# Patient Record
Sex: Female | Born: 1985 | Hispanic: Yes | Marital: Single | Smoking: Never smoker
Health system: Southern US, Community
[De-identification: ages and names within clinical notes are randomized; demographics above are authoritative.]

---

## 2019-01-19 ENCOUNTER — Emergency Department: Payer: Worker's Compensation

## 2019-01-19 ENCOUNTER — Encounter: Payer: Self-pay | Admitting: Emergency Medicine

## 2019-01-19 ENCOUNTER — Other Ambulatory Visit: Payer: Self-pay

## 2019-01-19 ENCOUNTER — Emergency Department
Admission: EM | Admit: 2019-01-19 | Discharge: 2019-01-19 | Disposition: A | Discharge status: Home / Self Care | Payer: Self-pay | Attending: Emergency Medicine | Admitting: Emergency Medicine

## 2019-01-19 DIAGNOSIS — Y929 Unspecified place or not applicable: Secondary | ICD-10-CM | POA: Insufficient documentation

## 2019-01-19 DIAGNOSIS — Z880 Allergy status to penicillin: Secondary | ICD-10-CM | POA: Insufficient documentation

## 2019-01-19 DIAGNOSIS — W228XXA Striking against or struck by other objects, initial encounter: Secondary | ICD-10-CM | POA: Insufficient documentation

## 2019-01-19 DIAGNOSIS — Y990 Civilian activity done for income or pay: Secondary | ICD-10-CM | POA: Insufficient documentation

## 2019-01-19 DIAGNOSIS — M7918 Myalgia, other site: Secondary | ICD-10-CM | POA: Insufficient documentation

## 2019-01-19 DIAGNOSIS — S060X0A Concussion without loss of consciousness, initial encounter: Secondary | ICD-10-CM | POA: Insufficient documentation

## 2019-01-19 DIAGNOSIS — Y9389 Activity, other specified: Secondary | ICD-10-CM | POA: Insufficient documentation

## 2019-01-19 DIAGNOSIS — M545 Low back pain, unspecified: Secondary | ICD-10-CM

## 2019-01-19 LAB — POCT PREGNANCY, URINE: Preg Test, Ur: NEGATIVE

## 2019-01-19 MED ORDER — CYCLOBENZAPRINE HCL 10 MG PO TABS: 10.0000 mg | ORAL_TABLET | Freq: Three times a day (TID) | ORAL | 0 refills | Status: DC | PRN

## 2019-01-19 MED ORDER — NAPROXEN 500 MG PO TABS: 500.0000 mg | ORAL_TABLET | Freq: Two times a day (BID) | ORAL | 0 refills | Status: DC

## 2019-01-19 MED ORDER — KETOROLAC TROMETHAMINE 30 MG/ML IJ SOLN
15.0000 mg | Freq: Once | INTRAMUSCULAR | Status: AC
  Administered 2019-01-19: 17:00:00 15 mg via INTRAMUSCULAR
  Filled 2019-01-19: qty 1

## 2019-01-19 NOTE — ED Notes (Signed)
This tech and hospital interpreter in room to complete urine drug screen and paperwork with pt. Pt stating she cannot urinate and requesting water. Will return to complete urine drug screen.

## 2019-01-19 NOTE — ED Provider Notes (Signed)
[facility] Emergency Department Provider Note   ____________________________________________   First MD Initiated Contact with Patient 01/19/19 1445     (approximate)  I have reviewed the triage vital signs and the nursing notes.   HISTORY  Chief Complaint Head Injury    HPI Willena Clydie BraunCorea Candis Musanriquez is a 33 y.o. female who presents to the emergency department for treatment and evaluation of head injury and back pain post fall at work 2 days ago. She was stacking large spools and they toppled over and landed on her back. She fell forward and hit her head on the concrete. Since then she has had nausea and dizziness with headache. No relief with tylenol.    History reviewed. No pertinent past medical history.  There are no active problems to display for this patient.   History reviewed. No pertinent surgical history.  Prior to Admission medications   Medication Sig Start Date End Date Taking? Authorizing Provider  cyclobenzaprine (FLEXERIL) 10 MG tablet Take 1 tablet (10 mg total) by mouth 3 (three) times daily as needed. 01/19/19   Jennfer Gassen, Rulon Eisenmengerari B, FNP  naproxen (NAPROSYN) 500 MG tablet Take 1 tablet (500 mg total) by mouth 2 (two) times daily with a meal. 01/19/19   Surena Welge B, FNP    Allergies Penicillins  No family history on file.  Social History Social History   Tobacco Use  . Smoking status: Never Smoker  . Smokeless tobacco: Never Used  Substance Use Topics  . Alcohol use: Not on file  . Drug use: Not on file    Review of Systems  Constitutional: No fever/chills Eyes: Positive for blurred vision. ENT: No sore throat. Cardiovascular: Denies chest pain. Respiratory: Denies shortness of breath. Gastrointestinal: No abdominal pain.  No nausea, no vomiting.  No diarrhea.  No constipation. Genitourinary: Negative for dysuria. Musculoskeletal: Negative for back pain. Skin: Negative for rash. Neurological: Positive for headaches,  negative for focal weakness or numbness.   ____________________________________________   PHYSICAL EXAM:  VITAL SIGNS: ED Triage Vitals  Enc Vitals Group     BP 01/19/19 1329 114/67     Pulse Rate 01/19/19 1329 (!) 55     Resp 01/19/19 1329 16     Temp 01/19/19 1329 98.3 F (36.8 C)     Temp Source 01/19/19 1329 Oral     SpO2 01/19/19 1329 100 %     Weight 01/19/19 1323 140 lb (63.5 kg)     Height --      Head Circumference --      Peak Flow --      Pain Score 01/19/19 1322 7     Pain Loc --      Pain Edu? --      Excl. in GC? --     Constitutional: Alert and oriented. Well appearing and in no acute distress. Eyes: Conjunctivae are normal. PERRL. EOMI. Lateral nystagmus bilaterally  Head: Atraumatic. Nose: No congestion/rhinnorhea. Mouth/Throat: Mucous membranes are moist.  Oropharynx non-erythematous. Neck: No stridor.   Cardiovascular: Normal rate, regular rhythm. Grossly normal heart sounds.  Good peripheral circulation. Respiratory: Normal respiratory effort.  No retractions. Lungs CTAB. Gastrointestinal: Soft and nontender. No distention. No abdominal bruits. No CVA tenderness. Musculoskeletal: No lower extremity tenderness nor edema.  No joint effusions. Neurologic:  Normal speech and language. No gross focal neurologic deficits are appreciated. No gait instability. Grip strength is equal.  Skin:  Skin is warm, dry and intact. No rash noted. Psychiatric: Mood and affect  are normal. Speech and behavior are normal.  ____________________________________________   LABS (all labs ordered are listed, but only abnormal results are displayed)  Labs Reviewed  POC URINE PREG, ED  POCT PREGNANCY, URINE   ____________________________________________  EKG  Not indicated. ____________________________________________  RADIOLOGY  ED MD interpretation: CT of the head is negative for acute findings per radiology.  Lumbar spine and cervical spine images are negative  for acute bony abnormality per radiology.    Official radiology report(s): Dg Cervical Spine 2-3 Views  Result Date: 01/19/2019 CLINICAL DATA:  Arrives from [facility] for ED evaluation of head injury. Patient hit head on steel bar on Saturday. Arrives today with c/o persistent nausea. Neck and lower back pain. EXAM: CERVICAL SPINE - 2-3 VIEW COMPARISON:  None. FINDINGS: There is no evidence of cervical spine fracture or prevertebral soft tissue swelling. Alignment is normal. No other significant bone abnormalities are identified. IMPRESSION: Negative cervical spine radiographs. Electronically Signed   By: Amie Portlandavid  Ormond M.D.   On: 01/19/2019 16:41   Dg Lumbar Spine 2-3 Views  Result Date: 01/19/2019 CLINICAL DATA:  [facility]. Patient hit head on steel bar on Saturday. Arrives today with c/o persistent nausea. Neck and lower back pain. EXAM: LUMBAR SPINE - 2-3 VIEW COMPARISON:  None. FINDINGS: No fracture or bone lesion. No spondylolisthesis. Disc spaces are well maintained. No degenerative change. There are calcifications in the right mid to upper abdomen, to the right of the upper endplate of L1, anterior to the renal shadow. These may reflect gallstones. Soft tissues are otherwise unremarkable. IMPRESSION: 1. Possible gallstones. Otherwise normal. No evidence of injury to the lumbar spine. Electronically Signed   By: Amie Portlandavid  Ormond M.D.   On: 01/19/2019 16:41   Ct Head Wo Contrast  Result Date: 01/19/2019 CLINICAL DATA:  Trauma to the head on Saturday with headaches. EXAM: CT HEAD WITHOUT CONTRAST TECHNIQUE: Contiguous axial images were obtained from the base of the skull through the vertex without intravenous contrast. COMPARISON:  None. FINDINGS: Brain: No evidence of acute infarction, hemorrhage, hydrocephalus, extra-axial collection or mass lesion/mass effect. Vascular: No hyperdense vessel or unexpected calcification. Skull: Normal. Negative for fracture or focal  lesion. Sinuses/Orbits: No acute finding. Other: None. IMPRESSION: No focal acute intracranial abnormality identified. Electronically Signed   By: Sherian ReinWei-Chen  Lin M.D.   On: 01/19/2019 16:32    ____________________________________________   PROCEDURES  Procedure(s) performed: None  Procedures  Critical Care performed: No  ____________________________________________   INITIAL IMPRESSION / ASSESSMENT AND PLAN / ED COURSE     33 year old female presenting to the emergency department for treatment and evaluation 2 days after mechanical, non-syncopal fall causing her to strike her head on the floor while at work.  See HPI for further details.  Due to continuation of headache, complaint of dizziness, blurred vision, and nystagmus on exam she will undergo CT scan of her head.  She has some diffuse and pretty nonfocal tenderness along the length of her spine with the greatest area of pain in the lumbar and cervical spine.  X-rays will be obtained.  CT of the head, image of the cervical spine, and lumbar spine are all reassuring.  The patient has been ambulatory in the emergency department without assistance and with a steady gait.  While here, she was given Toradol with some decrease in pain.  She will be given prescriptions for Naprosyn and Flexeril.  She is to follow-up with a primary care provider for choice  for symptoms are not improving over the next few days.  She will be provided a work note to return in 2 days.  She is to return to the emergency department for symptoms of change or worsen if she is unable to schedule an appointment.  Interpreters Ronnald Collum and [employer] utilized for exam and communication.    ____________________________________________   FINAL CLINICAL IMPRESSION(S) / ED DIAGNOSES  Final diagnoses:  Acute lumbar back pain  Concussion without loss of consciousness, initial encounter  Musculoskeletal pain     ED Discharge Orders         Ordered    cyclobenzaprine  (FLEXERIL) 10 MG tablet  3 times daily PRN     01/19/19 1827    naproxen (NAPROSYN) 500 MG tablet  2 times daily with meals     01/19/19 1827           Note:  This document was prepared using Dragon voice recognition software and may include unintentional dictation errors.    Victorino Dike, FNP 01/19/19 1843    Vanessa Lincoln, MD 01/19/19 2040

## 2019-01-19 NOTE — ED Notes (Signed)
Presents with head pain  States a buggy rolled back and hither in the head and back  Denies any LOC

## 2019-01-19 NOTE — ED Triage Notes (Signed)
Arrives from [facility] for ED evaluation of head injury. Patient hit head on steel bar on Saturday.  Arrives today with c/o persistent nausea.  Patient is AAOx3.  Skin warm and dry. NAD

## 2019-01-19 NOTE — Discharge Instructions (Addendum)
Do not drive or operate heavy machinery if you take the flexeril.   Follow up with the primary care provider of your choice in 2-3 days if you are not feeling better.  Return to the ER for symptoms that change or worsen if unable to schedule an appointment.

## 2019-01-25 ENCOUNTER — Emergency Department
Admission: EM | Admit: 2019-01-25 | Discharge: 2019-01-25 | Disposition: A | Discharge status: Home / Self Care | Payer: Self-pay | Attending: Emergency Medicine | Admitting: Emergency Medicine

## 2019-01-25 ENCOUNTER — Other Ambulatory Visit: Payer: Self-pay

## 2019-01-25 ENCOUNTER — Encounter: Payer: Self-pay | Admitting: Emergency Medicine

## 2019-01-25 DIAGNOSIS — R51 Headache: Secondary | ICD-10-CM | POA: Insufficient documentation

## 2019-01-25 DIAGNOSIS — R519 Headache, unspecified: Secondary | ICD-10-CM

## 2019-01-25 DIAGNOSIS — Z79899 Other long term (current) drug therapy: Secondary | ICD-10-CM | POA: Insufficient documentation

## 2019-01-25 DIAGNOSIS — Z20828 Contact with and (suspected) exposure to other viral communicable diseases: Secondary | ICD-10-CM | POA: Insufficient documentation

## 2019-01-25 LAB — SARS CORONAVIRUS 2 BY RT PCR (HOSPITAL ORDER, PERFORMED IN ~~LOC~~ HOSPITAL LAB): SARS Coronavirus 2: NEGATIVE

## 2019-01-25 MED ORDER — METOCLOPRAMIDE HCL 5 MG/ML IJ SOLN
10.0000 mg | Freq: Once | INTRAMUSCULAR | Status: AC
  Administered 2019-01-25: 10 mg via INTRAVENOUS
  Filled 2019-01-25: qty 2

## 2019-01-25 MED ORDER — SODIUM CHLORIDE 0.9 % IV BOLUS
1000.0000 mL | Freq: Once | INTRAVENOUS | Status: AC
  Administered 2019-01-25: 22:00:00 1000 mL via INTRAVENOUS

## 2019-01-25 MED ORDER — KETOROLAC TROMETHAMINE 30 MG/ML IJ SOLN
30.0000 mg | Freq: Once | INTRAMUSCULAR | Status: AC
  Administered 2019-01-25: 21:00:00 30 mg via INTRAVENOUS
  Filled 2019-01-25: qty 1

## 2019-01-25 MED ORDER — DIPHENHYDRAMINE HCL 50 MG/ML IJ SOLN
50.0000 mg | Freq: Once | INTRAMUSCULAR | Status: AC
  Administered 2019-01-25: 21:00:00 50 mg via INTRAVENOUS
  Filled 2019-01-25: qty 1

## 2019-01-25 NOTE — ED Triage Notes (Signed)
Arrives to ED today to be rechecked.  Patient was injured at work on 7/25 and was seen through the ED on 7/28.  Initially was given a work note and was told to return to ED today.  Patient arrives today with complaint of persistent dizziness and feeling weak.  States today woke up c/o voice hoarseness and productive cough.  Also  c/o 3 day history of chills intermittently.  Patient is AAOx3.  Skin warm and dry.   MAE equally and strong.  Voice clear and strong.  NAD

## 2019-01-25 NOTE — ED Provider Notes (Signed)
[facility] Emergency Department Provider Note  Time seen: 9:04 PM  I have reviewed the triage vital signs and the nursing notes.   HISTORY  Chief Complaint Dizziness Headache Cough   HPI Norma Salazar is a 33 y.o. female with no past medical history who presents to the emergency department for headache, cough and sore throat as well as dizziness.  According to the patient she had an injury at work on Saturday when she struck her head.  Patient was seen in the emergency department following that injury, possible concussion.  Ever since that accident the patient states she has had a moderate headache as well as dizziness.  States she was unable to get into her primary care doctor today so she came back to the emergency department for evaluation.  As a secondary complaint the patient also states since yesterday she has been coughing and has had a sore throat with subjective fever at home.  No known sick contacts.  Negative review of systems otherwise.   History reviewed. No pertinent past medical history.  There are no active problems to display for this patient.   History reviewed. No pertinent surgical history.  Prior to Admission medications   Medication Sig Start Date End Date Taking? Authorizing Provider  cyclobenzaprine (FLEXERIL) 10 MG tablet Take 1 tablet (10 mg total) by mouth 3 (three) times daily as needed. 01/19/19   Triplett, Rulon Eisenmengerari B, FNP  naproxen (NAPROSYN) 500 MG tablet Take 1 tablet (500 mg total) by mouth 2 (two) times daily with a meal. 01/19/19   Triplett, Cari B, FNP    Allergies  Allergen Reactions  . Penicillins Rash    No family history on file.  Social History Social History   Tobacco Use  . Smoking status: Never Smoker  . Smokeless tobacco: Never Used  Substance Use Topics  . Alcohol use: Not on file  . Drug use: Not on file    Review of Systems Constitutional: Subjective fever ENT: Sore throat Cardiovascular:  Negative for chest pain. Respiratory: Negative for shortness of breath.  Cough x2 days Gastrointestinal: Negative for abdominal pain, vomiting and diarrhea. Musculoskeletal: Negative for musculoskeletal complaints Skin: Negative for skin complaints  Neurological: Moderate headache All other ROS negative  ____________________________________________   PHYSICAL EXAM:  VITAL SIGNS: ED Triage Vitals  Enc Vitals Group     BP 01/25/19 1617 106/64     Pulse Rate 01/25/19 1617 69     Resp 01/25/19 1617 16     Temp 01/25/19 1617 98.8 F (37.1 C)     Temp Source 01/25/19 1617 Oral     SpO2 01/25/19 1617 100 %     Weight 01/25/19 1615 139 lb 15.9 oz (63.5 kg)     Height --      Head Circumference --      Peak Flow --      Pain Score 01/25/19 1614 5     Pain Loc --      Pain Edu? --      Excl. in GC? --    Constitutional: Alert and oriented. Well appearing and in no distress. Eyes: Normal exam ENT      Head: Normocephalic and atraumatic.      Mouth/Throat: Mucous membranes are moist. Cardiovascular: Normal rate, regular rhythm. Respiratory: Normal respiratory effort without tachypnea nor retractions. Breath sounds are clear  Gastrointestinal: Soft and nontender. No distention. Musculoskeletal: Nontender with normal range of motion in all extremities.  Neurologic:  Normal speech  and language. No gross focal neurologic deficits Skin:  Skin is warm, dry and intact.  Psychiatric: Mood and affect are normal.   ____________________________________________   INITIAL IMPRESSION / ASSESSMENT AND PLAN / ED COURSE  Pertinent labs & imaging results that were available during my care of the patient were reviewed by me and considered in my medical decision making (see chart for details).   Patient presents to the emergency department for sore throat cough headache and dizziness.  Patient had a head injury on Saturday was seen in the emergency department at that time.  Continues to have  headache and intermittent dizziness since that time, possible concussion.  Secondary complaint of cough since yesterday as well as subjective fever and sore throat.  Suspect postconcussive syndrome, we will treat the patient's headache with a migraine cocktail medications, we will check for coronavirus as a precaution.  Overall the patient appears extremely well with a reassuring physical exam and reassuring vitals.  Patient is requesting a work note as well.  Corona test is negative.  Patient is feeling better after migraine cocktail.  We will discharge home with PCP follow-up.  Norma Salazar was evaluated in Emergency Department on 01/25/2019 for the symptoms described in the history of present illness. She was evaluated in the context of the global COVID-19 pandemic, which necessitated consideration that the patient might be at risk for infection with the SARS-CoV-2 virus that causes COVID-19. Institutional protocols and algorithms that pertain to the evaluation of patients at risk for COVID-19 are in a state of rapid change based on information released by regulatory bodies including the CDC and federal and state organizations. These policies and algorithms were followed during the patient's care in the ED.  ____________________________________________   FINAL CLINICAL IMPRESSION(S) / ED DIAGNOSES  Headache Dizziness   Harvest Dark, MD 01/25/19 2236

## 2019-02-16 ENCOUNTER — Ambulatory Visit: Payer: Self-pay

## 2019-02-16 ENCOUNTER — Other Ambulatory Visit: Payer: Self-pay

## 2019-02-16 DIAGNOSIS — Z20822 Contact with and (suspected) exposure to covid-19: Secondary | ICD-10-CM

## 2019-02-16 NOTE — Telephone Encounter (Signed)
Message from Jodie Echevaria sent at 02/16/2019 2:44 PM EDT  Patient went for covid testing today but does not have anything to show her job that she have been tested. Asking for a call back so that she can get a letter as proof that she was tested. Patient speaks no english so she called with her boyfriend. Please call patient at Ph# [phone]    Called pt. via [employer]; ID # [phone].  Pt. Requested letter be faxed to her employer to show that she had COVID testing done today.  Pt's boyfriend also spoke on call, to assist pt. with communicating her needs.  Pt's boyfriend stated he tested positive, and has been in quarantine for 14 days, and now the pt. is having symptoms of loss of smell and taste, decreased appetite, and body aches.  Requesting to fax letter to [facility] @ [phone].  Advised pt. And boyfriend that a letter will be faxed this afternoon.  Verb. Understanding.  Agreed with plan.

## 2019-02-17 LAB — NOVEL CORONAVIRUS, NAA: SARS-CoV-2, NAA: NOT DETECTED

## 2019-03-09 ENCOUNTER — Other Ambulatory Visit: Payer: Self-pay

## 2019-03-09 DIAGNOSIS — Z20822 Contact with and (suspected) exposure to covid-19: Secondary | ICD-10-CM

## 2019-03-11 LAB — NOVEL CORONAVIRUS, NAA: SARS-CoV-2, NAA: NOT DETECTED

## 2019-03-31 ENCOUNTER — Telehealth: Payer: Self-pay | Admitting: *Deleted

## 2019-03-31 NOTE — Telephone Encounter (Signed)
Patient and her boyfriend, Norma Salazar acting as spanish interpreter, requesting a letter stating dates she was advised to quarantine due to exposure to her boyfriend who reportedly was positive covid19 with symptoms. Explained we could provide a faxed copy of the negative results to her employer, Norma Salazar @ [phone]. She has a letter from the health department regarding her quarantine time period. Advised to contact health department for letter with information regards to her symptoms and how long they advised she quarantine for. Stated they would contact health department for that information.  4:17p Request made to fax information.

## 2019-04-06 NOTE — Telephone Encounter (Signed)
Will request results are faxed to number below in previous message.

## 2019-04-06 NOTE — Telephone Encounter (Signed)
Patient's boyfriend calling back requesting this be refaxed. Fax number below is correct.

## 2019-05-03 ENCOUNTER — Encounter: Payer: Self-pay | Admitting: Emergency Medicine

## 2019-05-03 ENCOUNTER — Other Ambulatory Visit: Payer: Self-pay

## 2019-05-03 ENCOUNTER — Emergency Department
Admission: EM | Admit: 2019-05-03 | Discharge: 2019-05-03 | Disposition: A | Discharge status: Home / Self Care | Payer: BC Managed Care – PPO | Attending: Emergency Medicine | Admitting: Emergency Medicine

## 2019-05-03 DIAGNOSIS — J029 Acute pharyngitis, unspecified: Secondary | ICD-10-CM | POA: Diagnosis not present

## 2019-05-03 DIAGNOSIS — B349 Viral infection, unspecified: Secondary | ICD-10-CM | POA: Insufficient documentation

## 2019-05-03 DIAGNOSIS — Z20828 Contact with and (suspected) exposure to other viral communicable diseases: Secondary | ICD-10-CM | POA: Insufficient documentation

## 2019-05-03 DIAGNOSIS — R05 Cough: Secondary | ICD-10-CM | POA: Diagnosis present

## 2019-05-03 MED ORDER — PSEUDOEPH-BROMPHEN-DM 30-2-10 MG/5ML PO SYRP: 10.0000 mL | ORAL_SOLUTION | Freq: Four times a day (QID) | ORAL | 0 refills | Status: AC | PRN

## 2019-05-03 MED ORDER — MAGIC MOUTHWASH W/LIDOCAINE: 5.0000 mL | Freq: Four times a day (QID) | ORAL | 0 refills | Status: AC

## 2019-05-03 MED ORDER — ONDANSETRON 4 MG PO TBDP: 4.0000 mg | ORAL_TABLET | Freq: Three times a day (TID) | ORAL | 0 refills | Status: AC | PRN

## 2019-05-03 NOTE — ED Triage Notes (Signed)
Cough and sore throat x 3 days. Nausea with emesis x 1 yesterday.

## 2019-05-03 NOTE — ED Provider Notes (Signed)
[facility] Emergency Department Provider Note  ____________________________________________   None    (approximate)   I have reviewed the triage vital signs and the nursing notes.       HISTORY  Chief Complaint Nausea, Cough, and Sore Throat    HPI Norma Salazar is a 33 y.o. female presents to the emergency department with a complaint of cough and nausea. Patient has had 3 days of cough and last night began with nausea. Intermittent sore throat. 1 episode of emesis. No abdominal pain. No diarrhea. No urinary symptoms.       History reviewed. No pertinent past medical history.  There are no active problems to display for this patient.   History reviewed. No pertinent surgical history.  Prior to Admission medications   Medication Sig Start Date End Date Taking? Authorizing Provider  brompheniramine-pseudoephedrine-DM 30-2-10 MG/5ML syrup Take 10 mLs by mouth 4 (four) times daily as needed. 05/03/19   Cuthriell, Delorise Royals, PA-C  cyclobenzaprine (FLEXERIL) 10 MG tablet Take 1 tablet (10 mg total) by mouth 3 (three) times daily as needed. 01/19/19   Triplett, Rulon Eisenmenger B, FNP  magic mouthwash w/lidocaine SOLN Take 5 mLs by mouth 4 (four) times daily. 05/03/19   Cuthriell, Delorise Royals, PA-C  naproxen (NAPROSYN) 500 MG tablet Take 1 tablet (500 mg total) by mouth 2 (two) times daily with a meal. 01/19/19   Triplett, Cari B, FNP  ondansetron (ZOFRAN-ODT) 4 MG disintegrating tablet Take 1 tablet (4 mg total) by mouth every 8 (eight) hours as needed for nausea or vomiting. 05/03/19   Cuthriell, Delorise Royals, PA-C    Allergies Penicillins  No family history on file.  Social History Social History   Tobacco Use  . Smoking status: Never Smoker  . Smokeless tobacco: Never Used  Substance Use Topics  . Alcohol use: Not on file  . Drug use: Not on file    Review of Systems Constitutional: no fever ENT: positive nasal congestion/rhinorhea. positive  sore throat Cardiovascular: no chest pain. Respiratory: positive cough. no shortness of breath/difficulty breathing Gastroenterology: no abdominal pain. Positive for nausea and emesis.  Musculoskeletal: no for musculoskeletal pain Integumentary: Negative for rash. Neurological: No focal weakness nor numbness.   ____________________________________________   PHYSICAL EXAM:  VITAL SIGNS: ED Triage Vitals  Enc Vitals Group     BP      Pulse      Resp      Temp      Temp src      SpO2      Weight      Height      Head Circumference      Peak Flow      Pain Score      Pain Loc      Pain Edu?      Excl. in GC?     Constitutional: Alert and oriented. Generally well appearing and in no acute distress. Eyes: Conjunctivae are normal.  Nose: No significant congestion/rhinnorhea. Mouth: No gross oropharyngeal edema. no erythema/edema Neck: No stridor.  No meningeal signs.   Cardiovascular: Grossly normal heart sounds. Respiratory: Normal respiratory effort without significant tachypnea and no observed retractions. Lungs CTAB Gastrointestinal: No significant visible abdominal wall findings.  Bowel sounds x4 quadrants. mild tenderness to palpation in epigastric region. Musculoskeletal: No gross deformities of extremities. Neurologic:  Normal speech and language. No gross focal neurologic deficits are appreciated.  Skin:  Skin is warm, dry and intact. No rash noted.  ____________________________________________   LABS (all labs ordered are listed, but only abnormal results are displayed)  Labs Reviewed  SARS CORONAVIRUS 2 (TAT 6-24 HRS)    ____________________________________________   RADIOLOGY   Official radiology report(s): No results found.  ____________________________________________    INITIAL IMPRESSION / MDM / ASSESSMENT AND PLAN / ED COURSE  As part of my medical decision making, I reviewed the following data within the electronic MEDICAL RECORD NUMBER  Notes from prior ED visits      Clinical Impression: Viral illness   Plan: Covid swab. Symptomatic control medications will be prescribed. Follow-up with pcp as needed. Return precautions discussed with patient.  Patient has been screened based based on their arrival complaint, evaluated for an emergent condition.. Patient is stable for discharge    ____________________________________________  Note:  This document was prepared using Dragon voice recognition software and may include unintentional dictation errors.    Darletta Moll, PA-C 05/04/19 2258    Earleen Newport, MD 05/05/19 1049

## 2019-05-03 NOTE — ED Notes (Signed)
See triage note  States she has cough and sore throat for 3 days    Afebrile on arrival

## 2019-05-04 LAB — SARS CORONAVIRUS 2 (TAT 6-24 HRS): SARS Coronavirus 2: NEGATIVE

## 2019-05-06 ENCOUNTER — Telehealth: Payer: Self-pay | Admitting: General Practice

## 2019-05-06 NOTE — Telephone Encounter (Signed)
Negative COVID results given. Patient results "NOT Detected." Caller expressed understanding. ° °

## 2020-07-17 IMAGING — CR CERVICAL SPINE - 2-3 VIEW
1 series · 3 of 3 positions shown · non-contrast
Comparison: None.

CLINICAL DATA: Arrives from [REDACTED] for ED evaluation of head injury.
Patient hit head on steel bar on [REDACTED]. Arrives today with c/o
persistent nausea. Neck and lower back pain.

EXAM:
CERVICAL SPINE - 2-3 VIEW

[Series 1: dg cervical spine 2 or 3 views · 0.14mm/px · 3 of 3 slices shown]
[im 1/3]
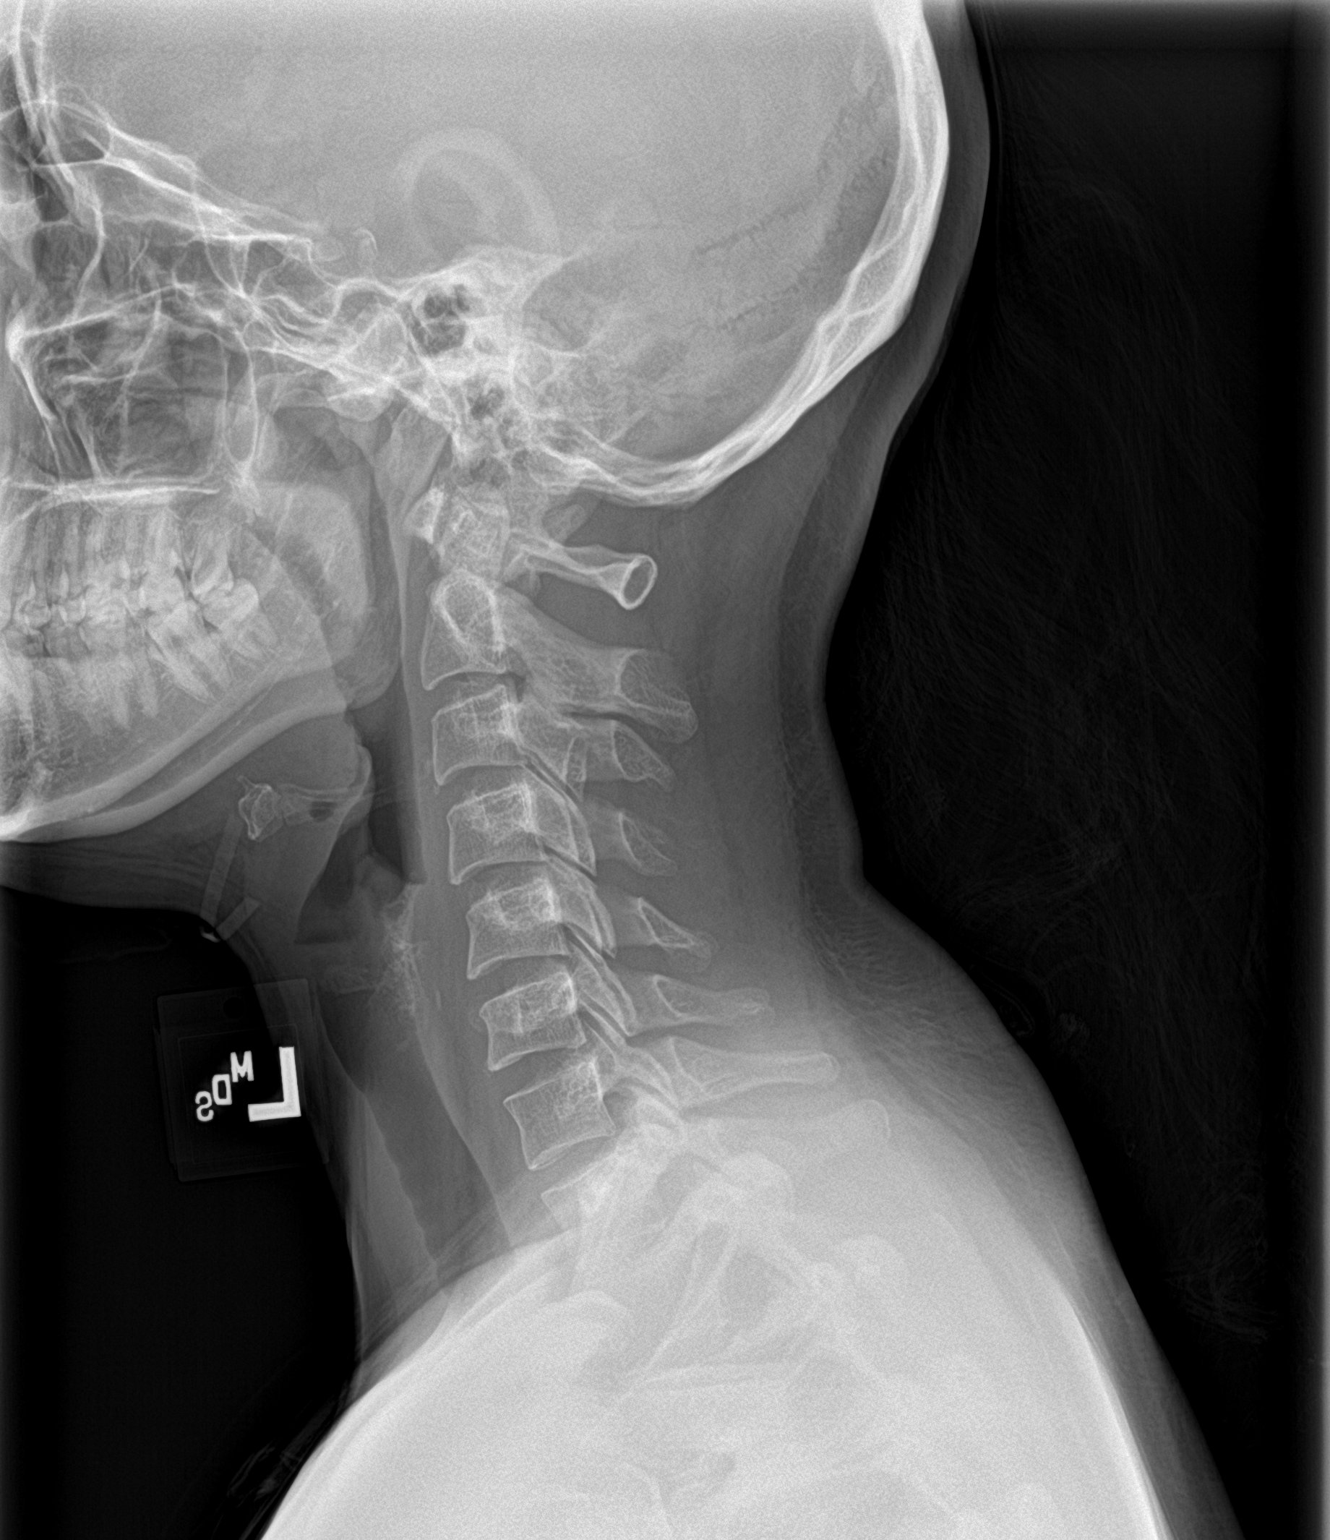
[im 2/3]
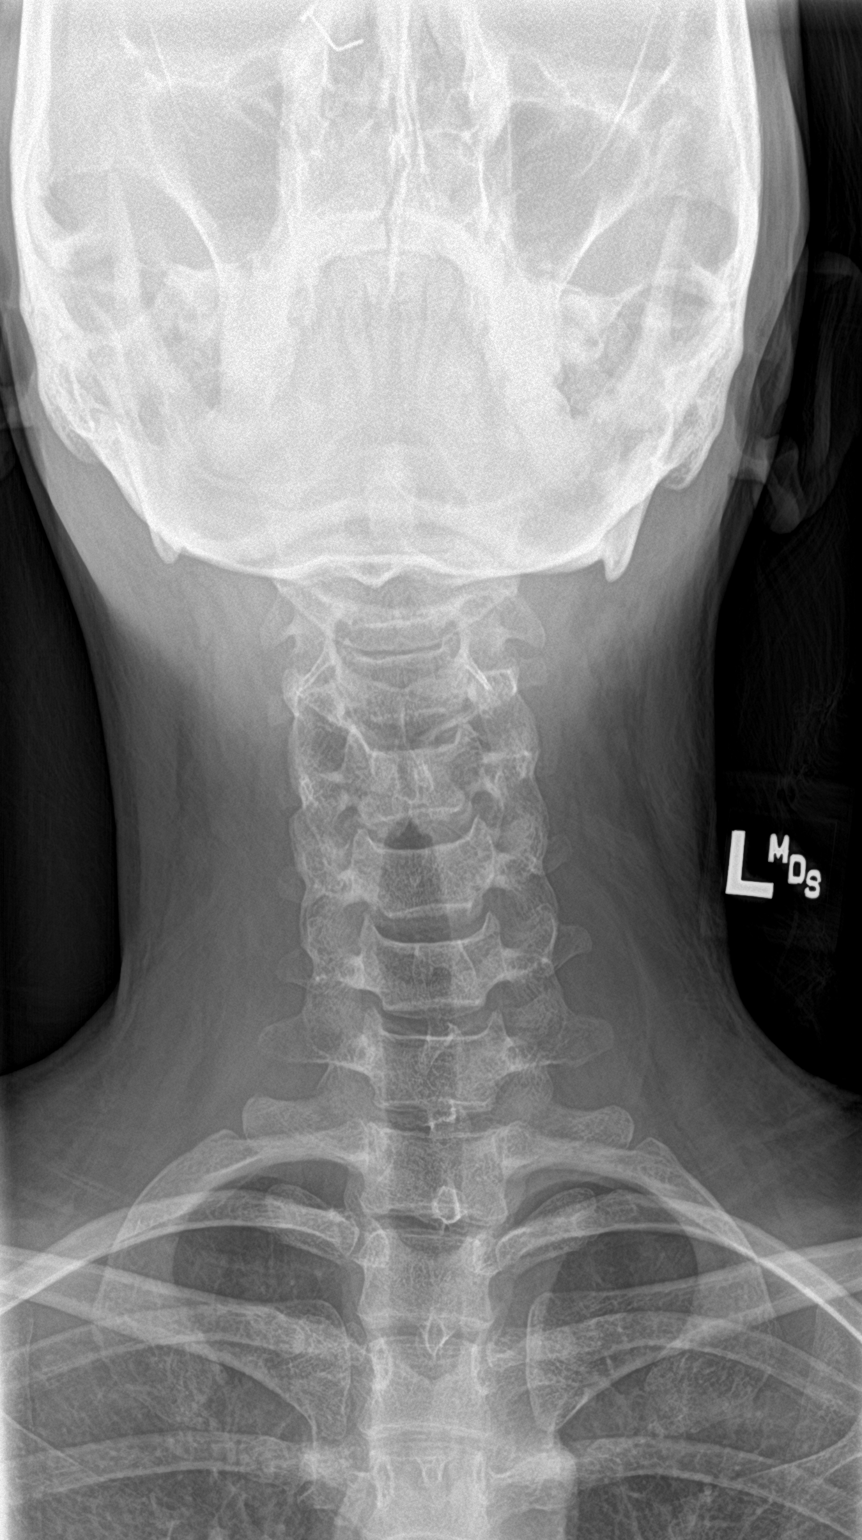
[im 3/3]
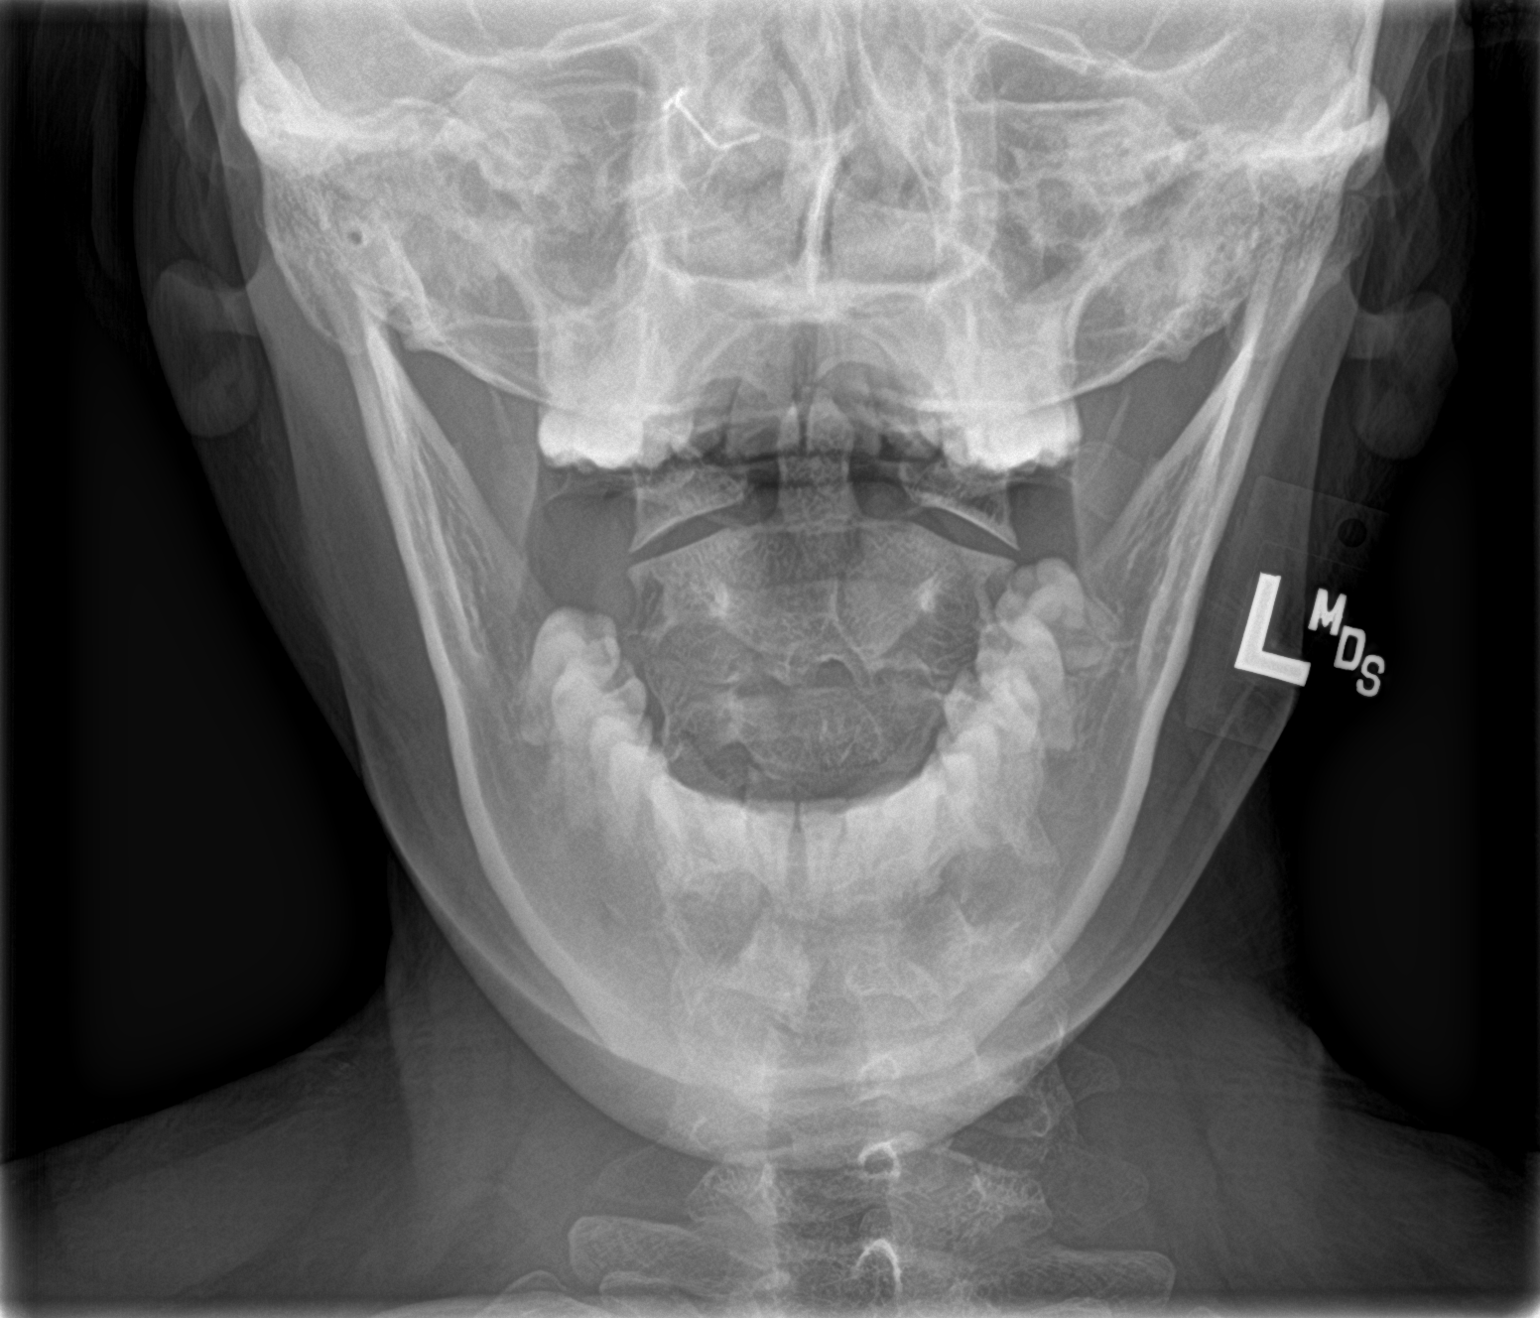

[3 of 3 positions shown; findings below may reference images not displayed]

FINDINGS: There is no evidence of cervical spine fracture or prevertebral soft
tissue swelling. Alignment is normal. No other significant bone
abnormalities are identified.
IMPRESSION: Negative cervical spine radiographs.

## 2020-07-17 IMAGING — CT CT HEAD WITHOUT CONTRAST
3 series · 15 of 44 positions shown, 18 images · non-contrast
Comparison: None.

CLINICAL DATA: Trauma to the head on [REDACTED] with headaches.

EXAM:
CT HEAD WITHOUT CONTRAST
TECHNIQUE: Contiguous axial images were obtained from the base of the skull
through the vertex without intravenous contrast.

[Series 2: head wo · axial · 0.39mm/px · z∈[+306,+416]mm · 9 of 27 slices shown, 12 images]
[im 3/27  brain]
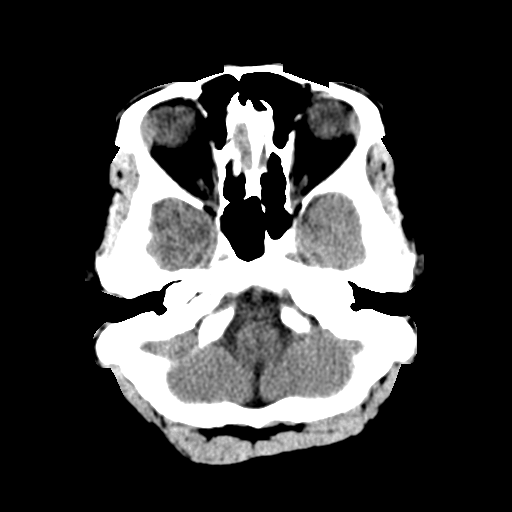
[im 3/27  bone]
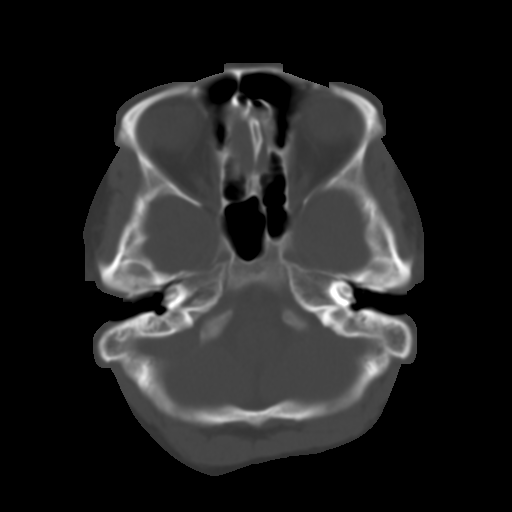
[im 6/27  brain]
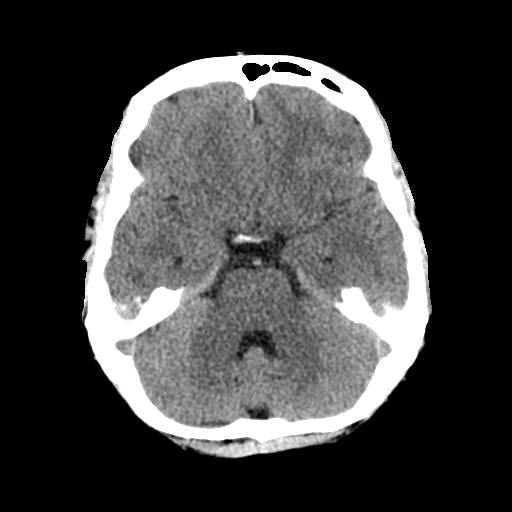
[im 8/27  brain]
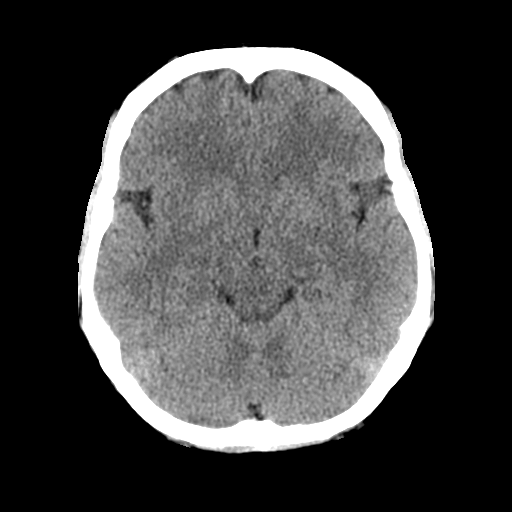
[im 11/27  brain]
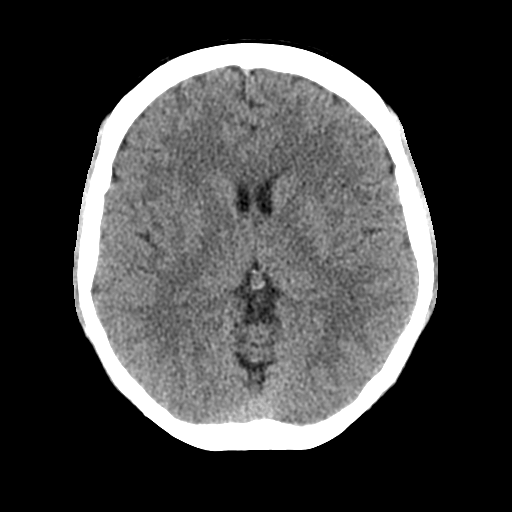
[im 14/27  brain]
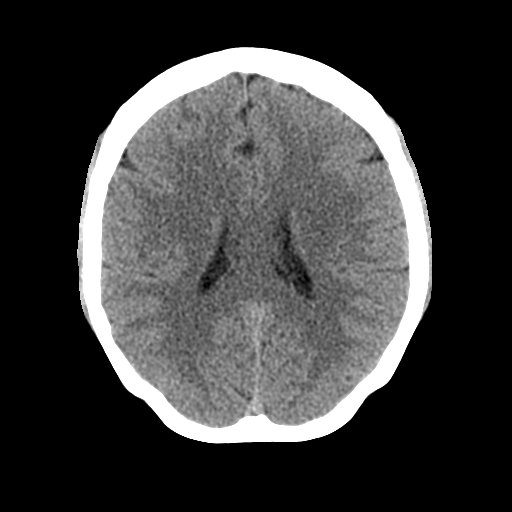
[im 14/27  bone]
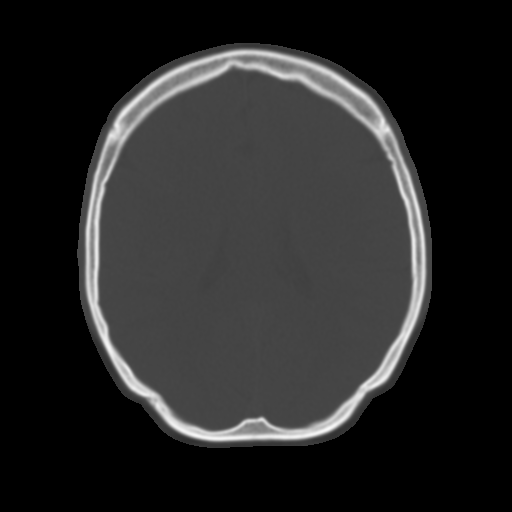
[im 17/27  brain]
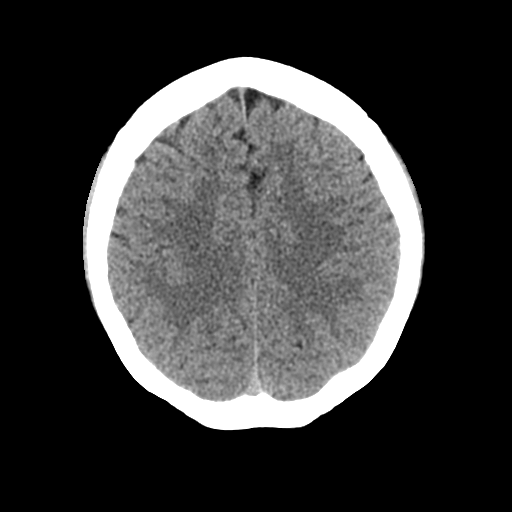
[im 20/27  brain]
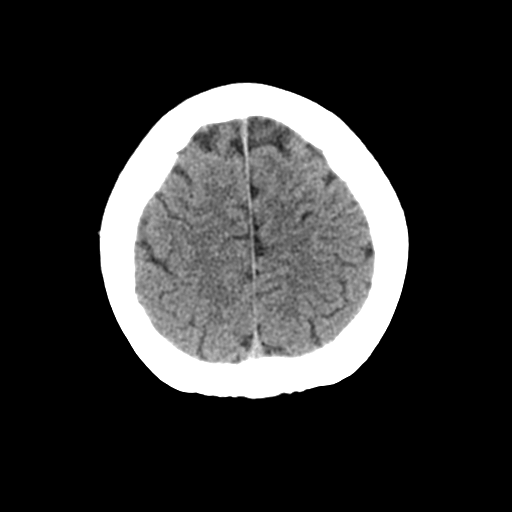
[im 22/27  brain]
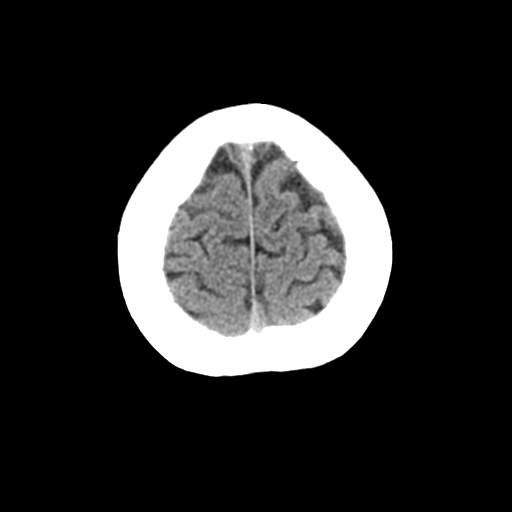
[im 25/27  brain]
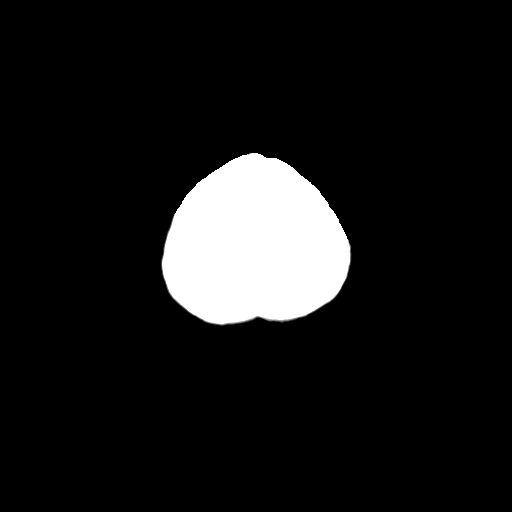
[im 25/27  bone]
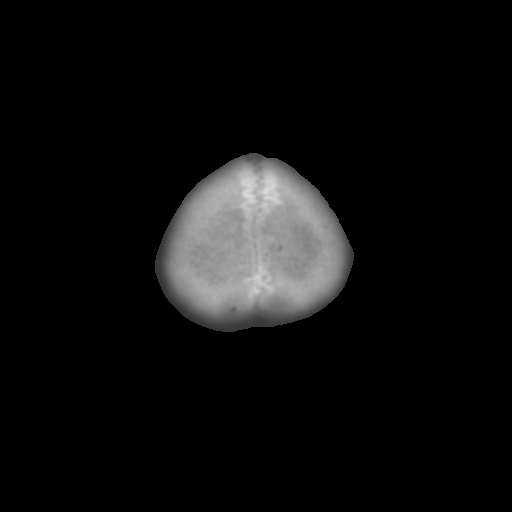

[Series 4: coronal soft tissue · coronal · 0.27mm/px · 3 of 56 slices shown]
[im 19/56  brain]
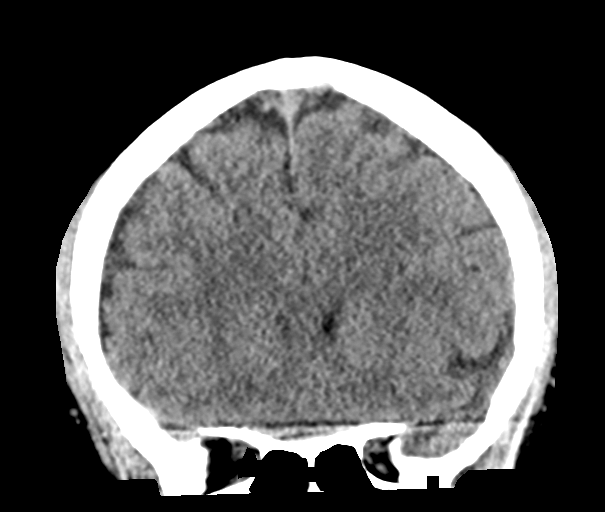
[im 25/56  brain]
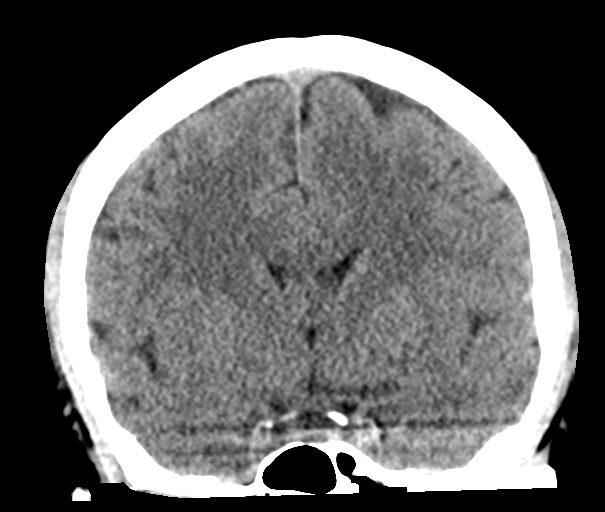
[im 31/56  brain]
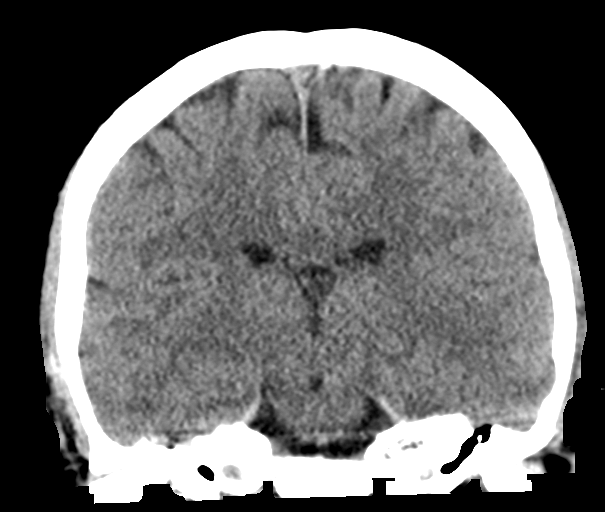

[Series 5: sagittal soft tissue · sagittal · 0.27mm/px · 3 of 52 slices shown]
[im 18/52  brain]
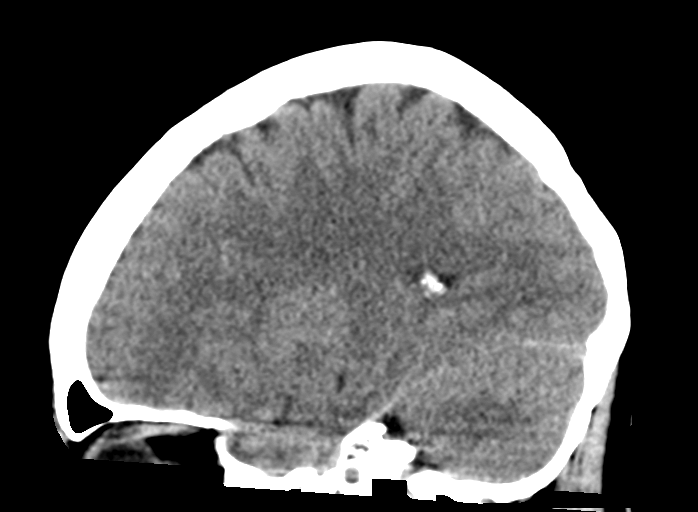
[im 26/52  brain]
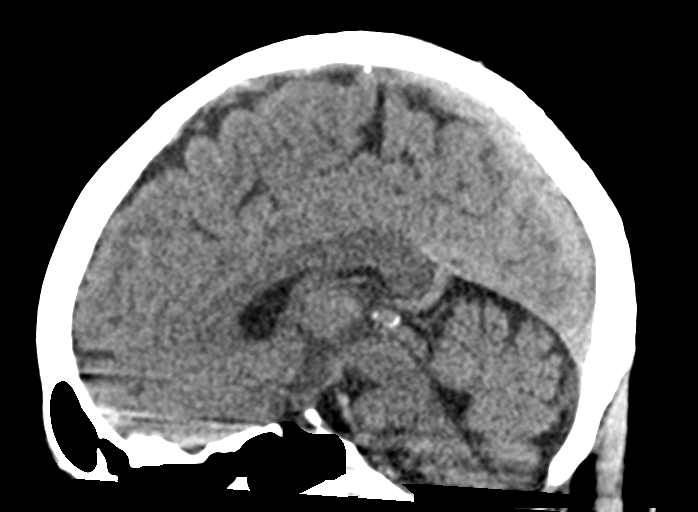
[im 35/52  brain]
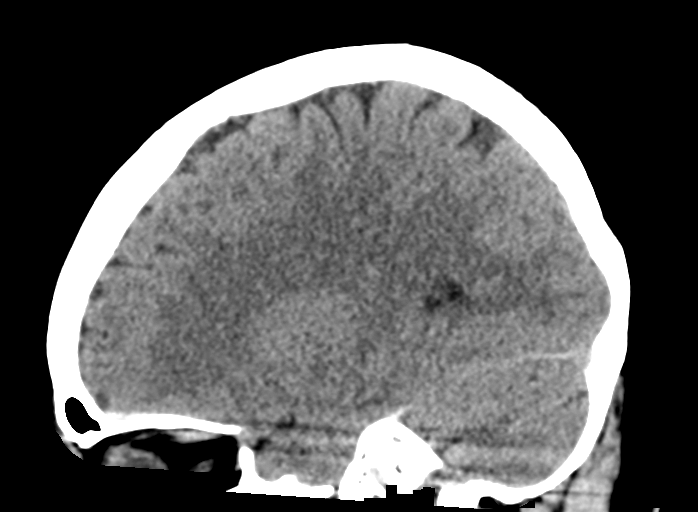

[15 of 44 positions shown; findings below may reference images not displayed]

FINDINGS: Brain: No evidence of acute infarction, hemorrhage, hydrocephalus,
extra-axial collection or mass lesion/mass effect.

Vascular: No hyperdense vessel or unexpected calcification.

Skull: Normal. Negative for fracture or focal lesion.

Sinuses/Orbits: No acute finding.

Other: None.
IMPRESSION: No focal acute intracranial abnormality identified.
